# Patient Record
Sex: Female | Born: 1935 | Race: White | Hispanic: No
Health system: Midwestern US, Academic
[De-identification: ages and names within clinical notes are randomized; demographics above are authoritative.]

---

## 2019-09-21 ENCOUNTER — Ambulatory Visit: Admit: 2019-09-21 | Discharge: 2019-09-22 | Payer: MEDICARE

## 2019-09-21 ENCOUNTER — Encounter: Admit: 2019-09-21 | Discharge: 2019-09-21 | Payer: MEDICARE

## 2019-09-21 NOTE — Progress Notes
Karen Reeves returns to the clinic for a 32yr FUV.   A repeat MRI of the head was done at an OSH for surveillance of a brain lesion.   Images are available for review on PACS.   She was last seen in the [facility] on 09/24/16.     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  The tumor has been stable for several years.  Clinically she is has been complaining of dizziness and disequilibrium for the past year or so.  Minimal headache.  A CT scan was ordered to follow-up on the tumor.    I reviewed her CT. It shows stable appearance of the intraventricular meningioma. This tumor has been stable since at least 2013    Plan:  In view of the stable appearance of the tumor for so many years, I see no indication for a neurosurgical intervention.  She will continue to follow-up with [place] as needed

## 2019-12-18 ENCOUNTER — Encounter: Admit: 2019-12-18 | Discharge: 2019-12-18 | Payer: MEDICARE

## 2020-01-24 ENCOUNTER — Encounter: Admit: 2020-01-24 | Discharge: 2020-01-24 | Payer: MEDICARE

## 2020-02-01 ENCOUNTER — Encounter: Admit: 2020-02-01 | Discharge: 2020-02-01 | Payer: MEDICARE

## 2021-05-09 ENCOUNTER — Encounter: Admit: 2021-05-09 | Discharge: 2021-05-09 | Payer: MEDICARE

## 2021-05-09 DIAGNOSIS — R32 Unspecified urinary incontinence: Secondary | ICD-10-CM

## 2021-05-09 DIAGNOSIS — G9389 Other specified disorders of brain: Secondary | ICD-10-CM

## 2021-05-09 DIAGNOSIS — J45909 Unspecified asthma, uncomplicated: Secondary | ICD-10-CM

## 2021-05-09 DIAGNOSIS — N200 Calculus of kidney: Secondary | ICD-10-CM

## 2021-05-09 DIAGNOSIS — M1990 Unspecified osteoarthritis, unspecified site: Secondary | ICD-10-CM

## 2021-05-09 DIAGNOSIS — E785 Hyperlipidemia, unspecified: Secondary | ICD-10-CM

## 2021-05-09 DIAGNOSIS — I2510 Atherosclerotic heart disease of native coronary artery without angina pectoris: Secondary | ICD-10-CM

## 2022-11-15 IMAGING — CT HEADWO
3 of 8 series · 13 of 47 positions shown, 15 images · non-contrast
Comparison: None

PROCEDURE: HEADWO
HISTORY: fall, c/o face, head and neck pain. pt in c-collar. pt uncooperative with scan. repeats
made of
head and neck. AK prior 10/06/19
TECHNIQUE: Axial CT of the brain was obtained without the use of intravenous contrast. Subsequently,
coronal and sagittal reconstructions were obtained at a separate workstation.This exam was performed
using one or more the following dose reduction techniques: Automated exposure control, adjustment of
the
mA and/or KV according to the patient's size or use of iterative reconstruction technique. Total DLP
dose
measures 4191 mGyXcm.

[Series 4: brain cor 5.00 hr40 s3 · coronal · 0.30mm/px · 3 of 44 slices shown]
[im 11/44  brain]
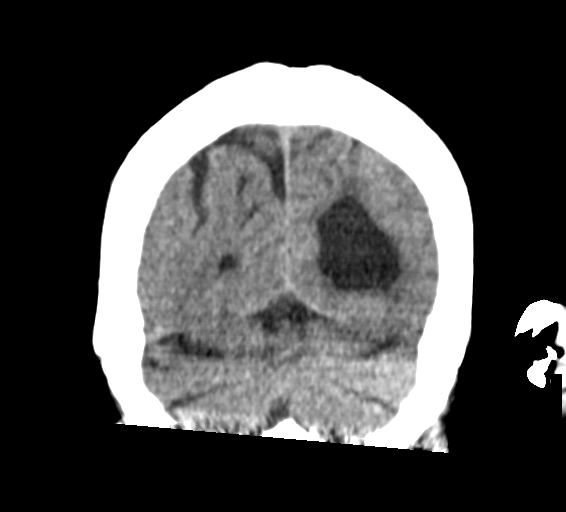
[im 22/44  brain]
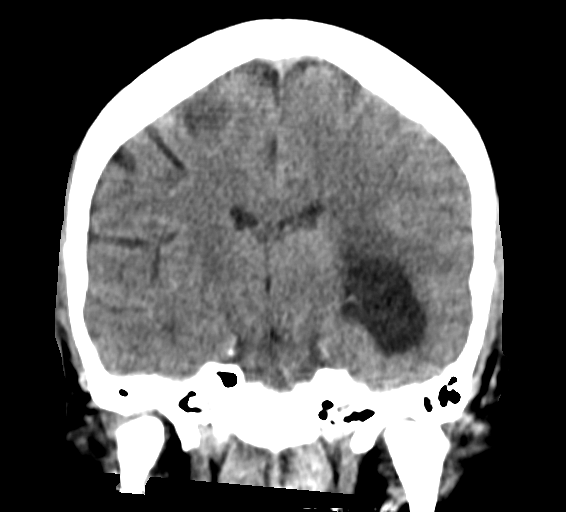
[im 33/44  brain]
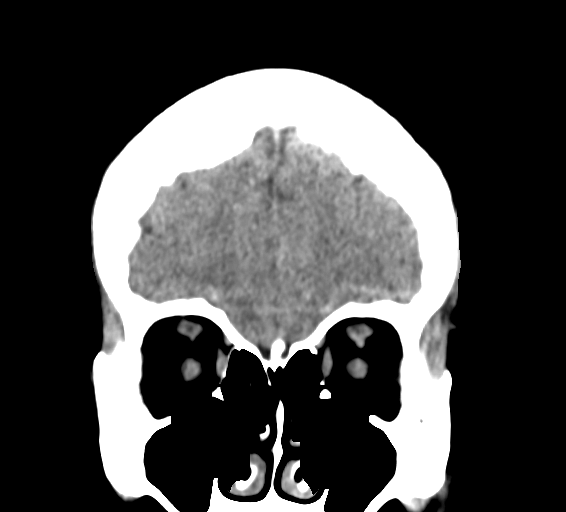

[Series 12: brain sag 5.00 hr40 s3 · sagittal · 0.33mm/px · 2 of 34 slices shown]
[im 12/34  brain]
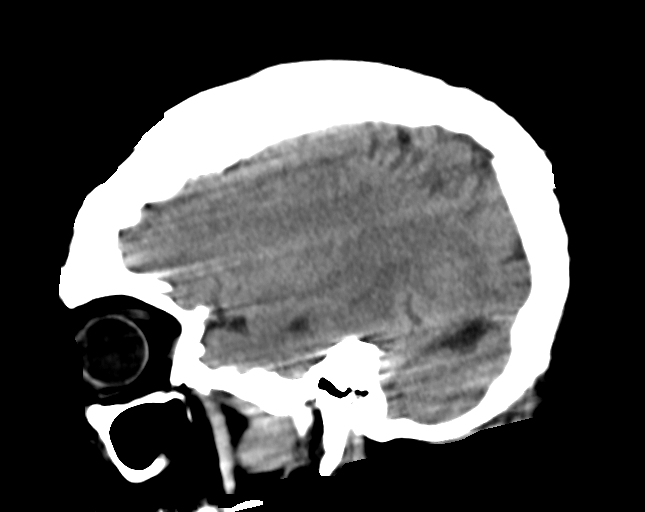
[im 23/34  brain]
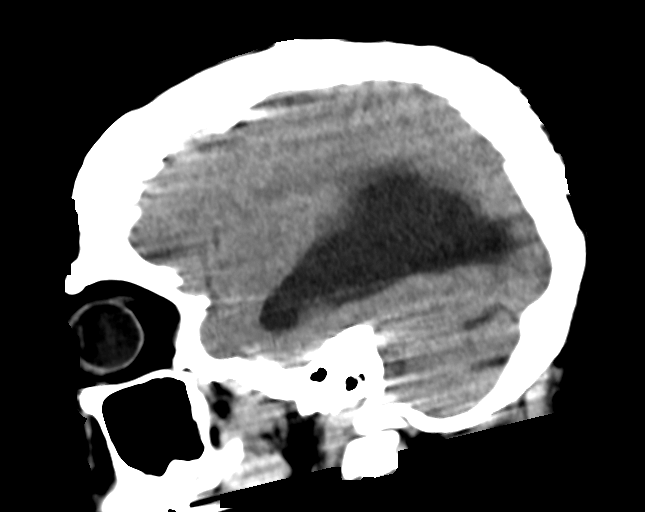

[Series 14: brain ax 2.00 hr60 s3 · axial · 0.33mm/px · z∈[-589,-465]mm · 8 of 76 slices shown, 10 images]
[im 8/76  brain]
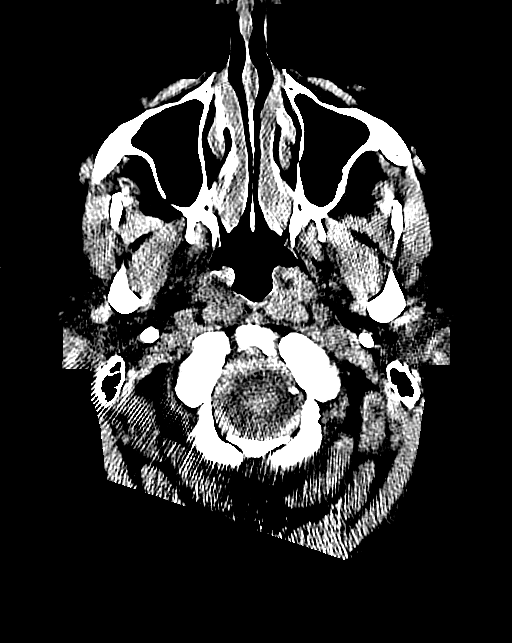
[im 8/76  bone]
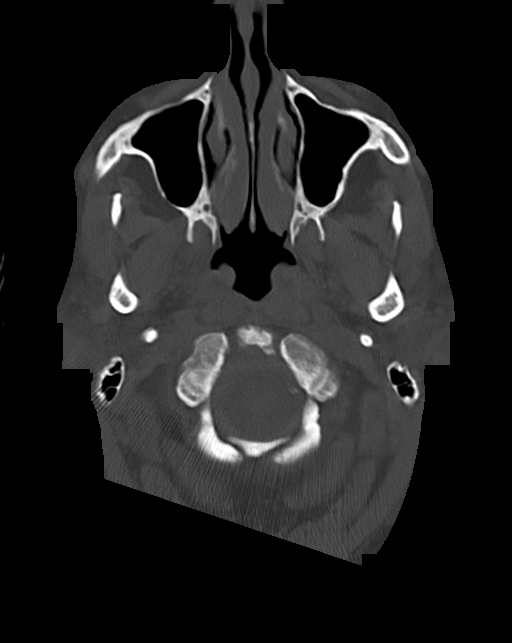
[im 16/76  brain]
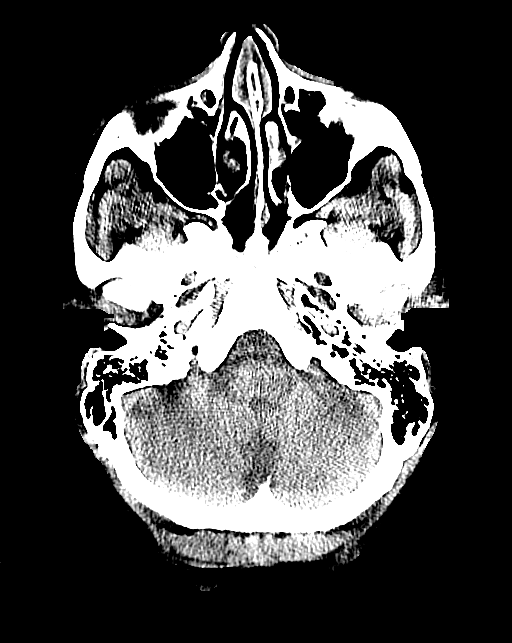
[im 23/76  brain]
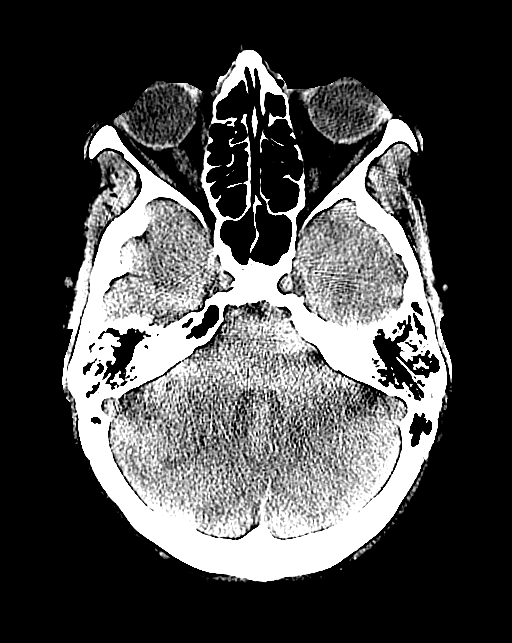
[im 31/76  brain]
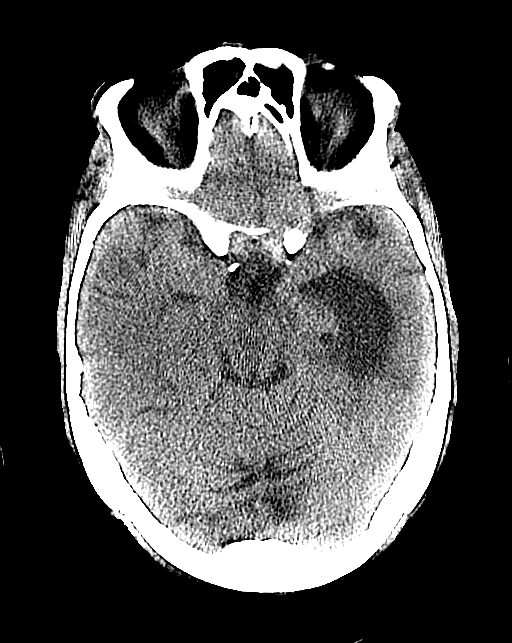
[im 46/76  brain]
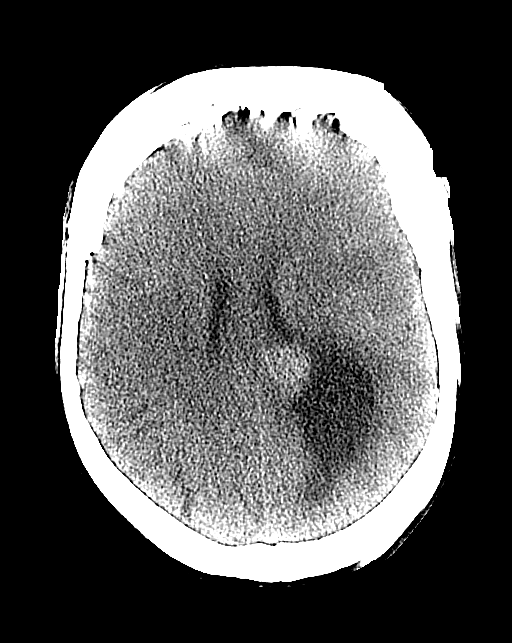
[im 46/76  bone]
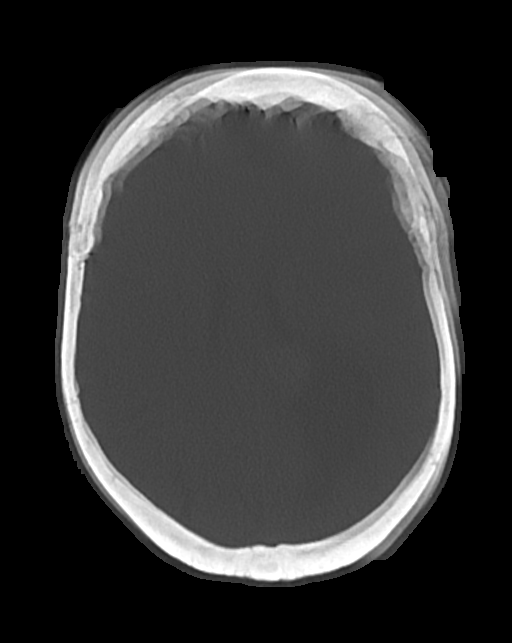
[im 53/76  brain]
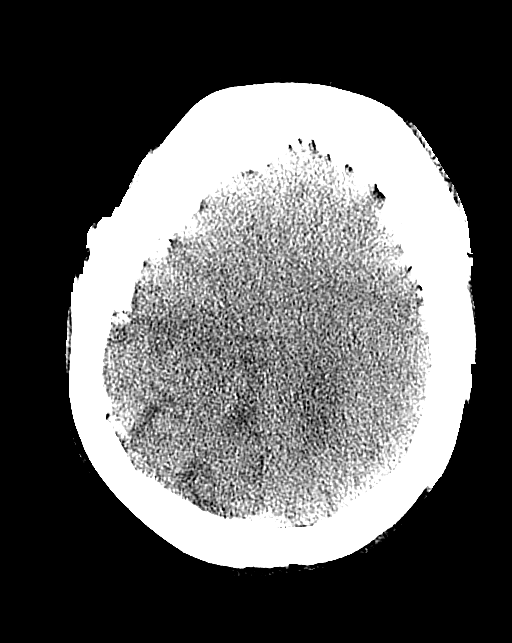
[im 61/76  brain]
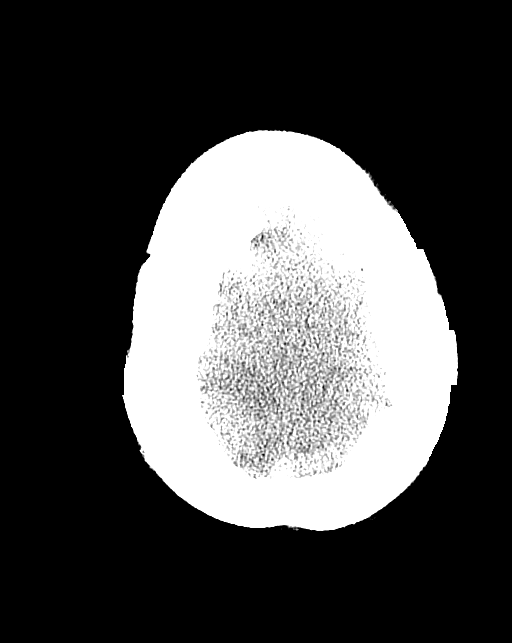
[im 68/76  brain]
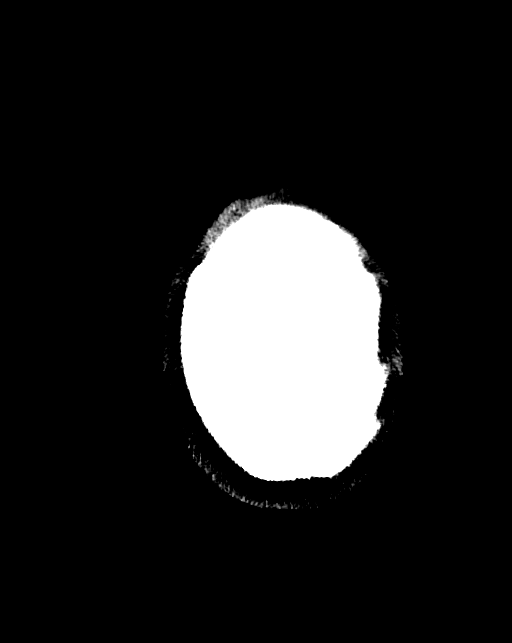

[13 of 47 positions shown; findings below may reference images not displayed]

FINDINGS: There is a 2.8 cm lesion within the left lateral ventricle posterior horn, unchanged.
There is stable enlargement of the left ventricle posterior horn.

No acute intracranial hemorrhage, mass-effect or midline shift.
No extra-axial fluid collections.
The basal cisterns are patent.
Mild nonspecific periventricular white matter disease.
The mucosal sinuses are clear.
No displaced calvarial fractures.
The mastoid air cells are clear bilaterally.
The orbits are normal.

The extracranial soft tissues are unremarkable.
IMPRESSION: 1. No acute intracranial hemorrhage or midline shift.

Tech Notes:

[REDACTED]

## 2022-11-16 IMAGING — CR SHOULDCMLT
2 series · 2 of 2 positions shown · non-contrast
Comparison: None

PROCEDURE: SHOULDCMLT
HISTORY: fall, c/o left shoulder pain. AK
TECHNIQUE: 3 views of the left shoulder

[AP]
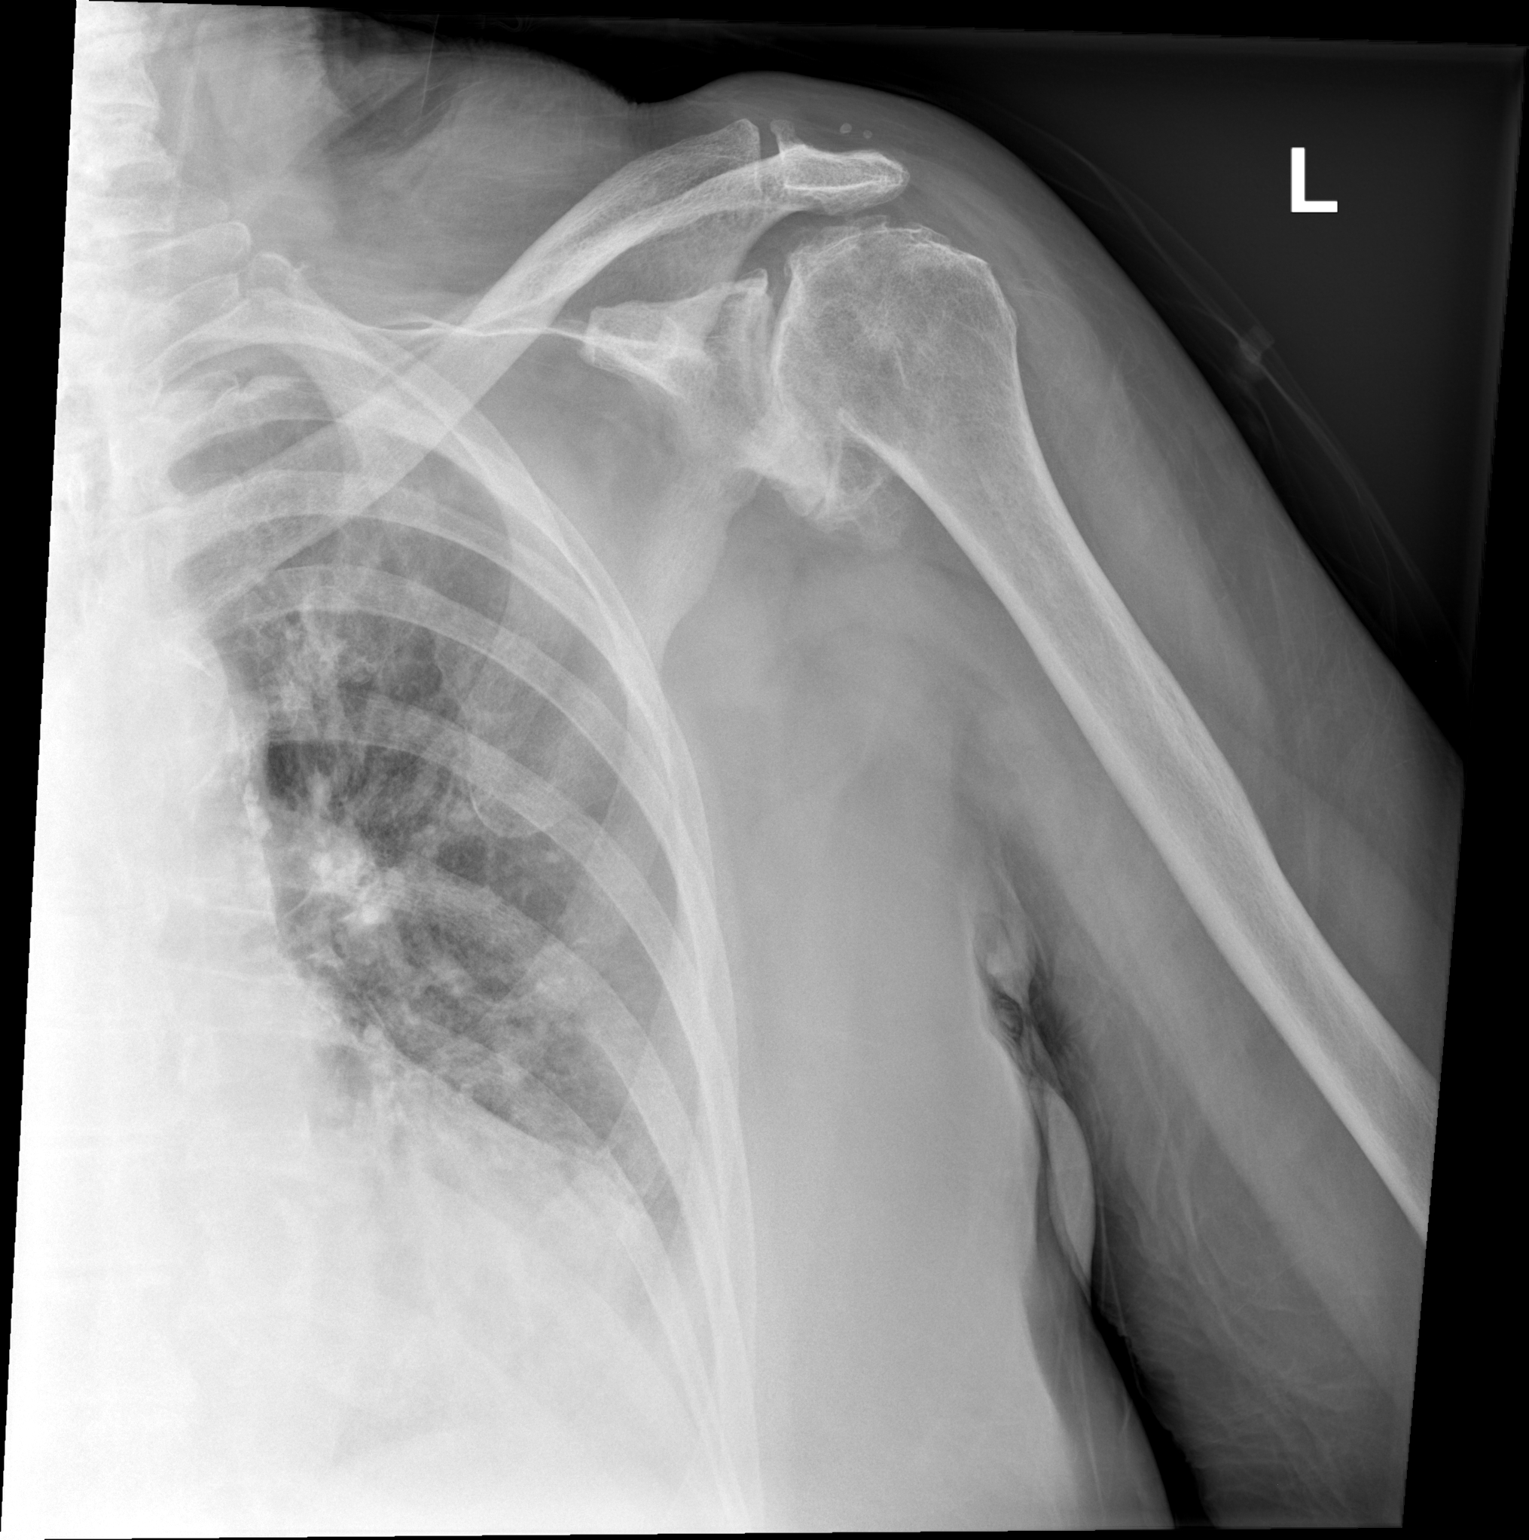

[x shoulder y-view ap left]
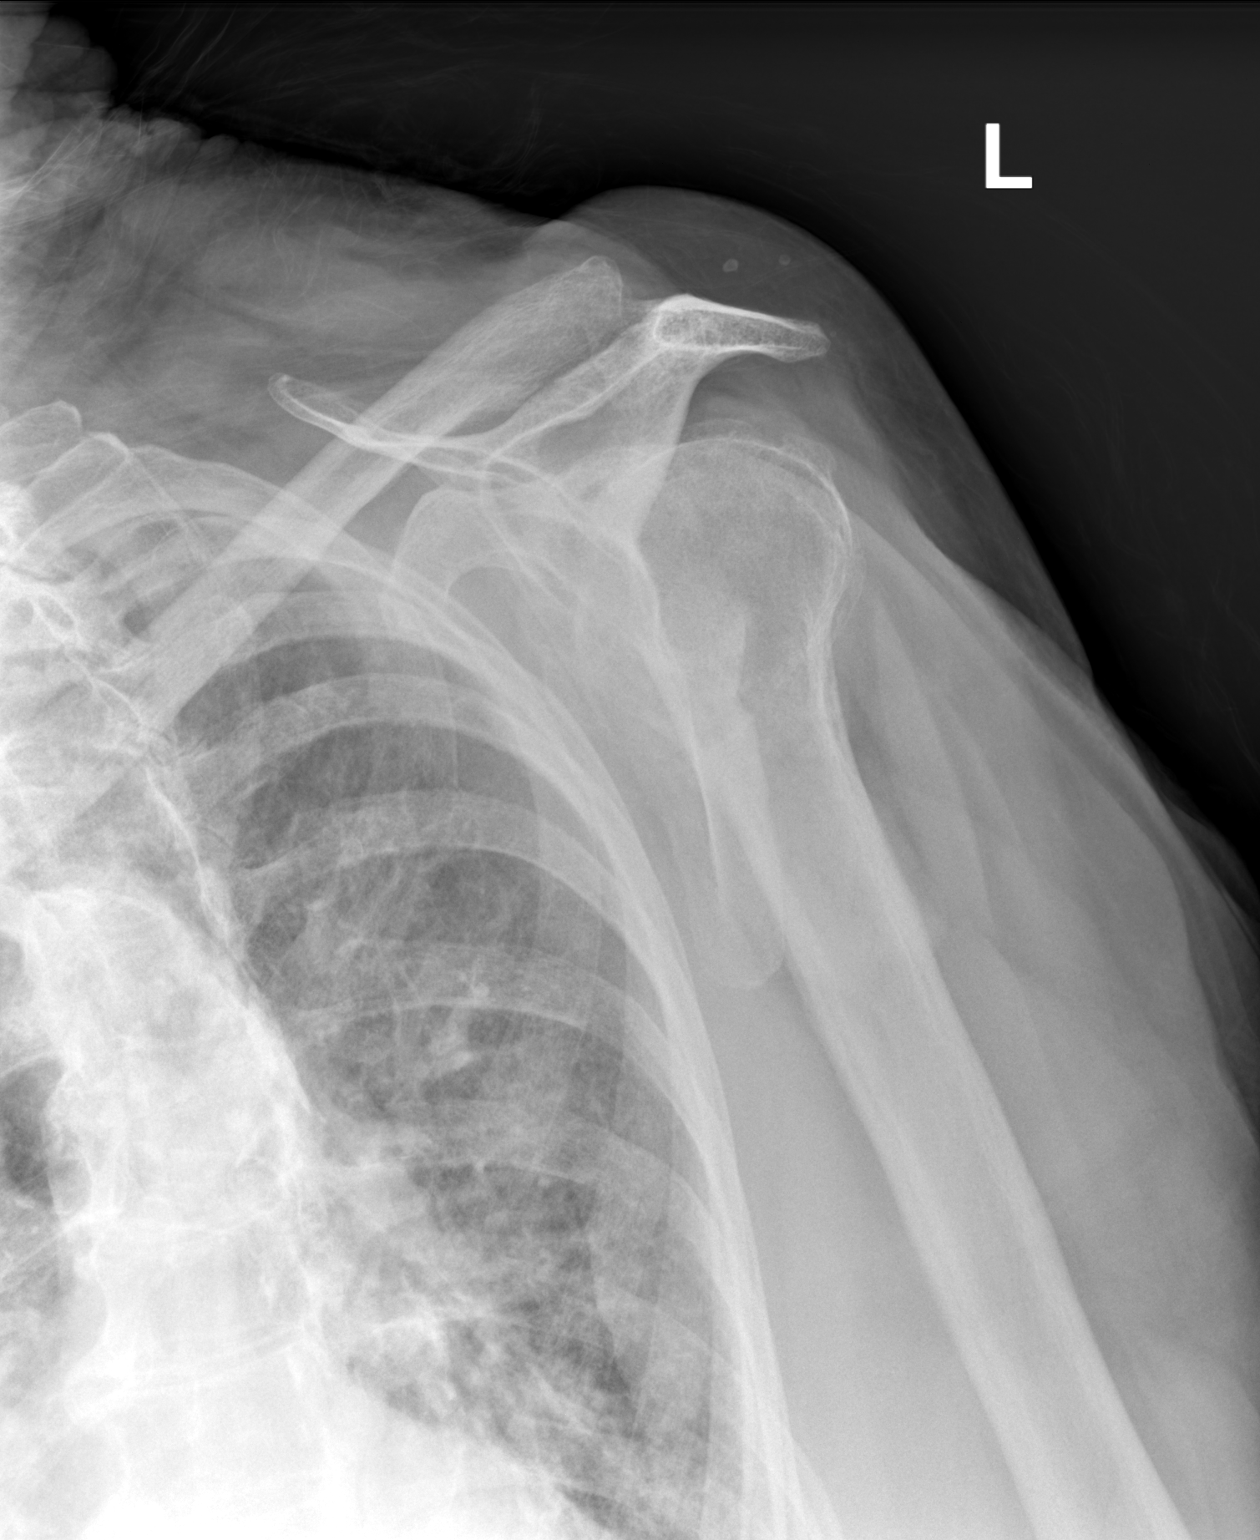

[2 of 2 positions shown; findings below may reference images not displayed]

FINDINGS: No evidence of a dislocation.
There is a linear interface/deformity along the inferior glenoid.
Marked degenerative changes of the glenohumeral joint.
The soft tissues are grossly intact.
IMPRESSION: 1. Severe degenerative changes. Acute fracture cannot entirely be excluded. CT may be helpful.
2. Findings which may represent an old inferior glenoid fracture.

Tech Notes:

[REDACTED]

## 2023-02-26 DEATH — deceased
